# Patient Record
Sex: Female | Born: 2000
Health system: Southern US, Community
[De-identification: ages and names within clinical notes are randomized; demographics above are authoritative.]

## PROBLEM LIST (undated history)

## (undated) DIAGNOSIS — T7840XA Allergy, unspecified, initial encounter: Secondary | ICD-10-CM

---
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
## 2021-01-12 DIAGNOSIS — F331 Major depressive disorder, recurrent, moderate: Secondary | ICD-10-CM | POA: Diagnosis not present

## 2021-01-22 DIAGNOSIS — J4520 Mild intermittent asthma, uncomplicated: Secondary | ICD-10-CM | POA: Diagnosis not present

## 2021-01-22 DIAGNOSIS — M549 Dorsalgia, unspecified: Secondary | ICD-10-CM | POA: Diagnosis not present

## 2021-01-22 DIAGNOSIS — F332 Major depressive disorder, recurrent severe without psychotic features: Secondary | ICD-10-CM | POA: Diagnosis not present

## 2021-01-22 DIAGNOSIS — Z0000 Encounter for general adult medical examination without abnormal findings: Secondary | ICD-10-CM | POA: Diagnosis not present

## 2021-01-23 DIAGNOSIS — Z0000 Encounter for general adult medical examination without abnormal findings: Secondary | ICD-10-CM | POA: Diagnosis not present

## 2021-01-23 DIAGNOSIS — F338 Other recurrent depressive disorders: Secondary | ICD-10-CM | POA: Diagnosis not present

## 2021-01-23 DIAGNOSIS — M549 Dorsalgia, unspecified: Secondary | ICD-10-CM | POA: Diagnosis not present

## 2021-01-23 DIAGNOSIS — J4520 Mild intermittent asthma, uncomplicated: Secondary | ICD-10-CM | POA: Diagnosis not present

## 2021-01-23 DIAGNOSIS — Z13220 Encounter for screening for lipoid disorders: Secondary | ICD-10-CM | POA: Diagnosis not present

## 2021-01-26 DIAGNOSIS — F331 Major depressive disorder, recurrent, moderate: Secondary | ICD-10-CM | POA: Diagnosis not present

## 2021-02-23 DIAGNOSIS — F331 Major depressive disorder, recurrent, moderate: Secondary | ICD-10-CM | POA: Diagnosis not present

## 2021-03-24 DIAGNOSIS — F331 Major depressive disorder, recurrent, moderate: Secondary | ICD-10-CM | POA: Diagnosis not present

## 2021-04-21 DIAGNOSIS — F331 Major depressive disorder, recurrent, moderate: Secondary | ICD-10-CM | POA: Diagnosis not present

## 2021-06-03 DIAGNOSIS — J301 Allergic rhinitis due to pollen: Secondary | ICD-10-CM | POA: Diagnosis not present

## 2021-06-03 DIAGNOSIS — J4540 Moderate persistent asthma, uncomplicated: Secondary | ICD-10-CM | POA: Diagnosis not present

## 2021-06-03 DIAGNOSIS — J3089 Other allergic rhinitis: Secondary | ICD-10-CM | POA: Diagnosis not present

## 2021-06-03 DIAGNOSIS — R21 Rash and other nonspecific skin eruption: Secondary | ICD-10-CM | POA: Diagnosis not present

## 2021-06-12 DIAGNOSIS — F332 Major depressive disorder, recurrent severe without psychotic features: Secondary | ICD-10-CM | POA: Diagnosis not present

## 2021-06-12 DIAGNOSIS — R682 Dry mouth, unspecified: Secondary | ICD-10-CM | POA: Diagnosis not present

## 2021-06-18 DIAGNOSIS — F331 Major depressive disorder, recurrent, moderate: Secondary | ICD-10-CM | POA: Diagnosis not present

## 2021-08-18 DIAGNOSIS — F331 Major depressive disorder, recurrent, moderate: Secondary | ICD-10-CM | POA: Diagnosis not present

## 2021-09-15 DIAGNOSIS — F331 Major depressive disorder, recurrent, moderate: Secondary | ICD-10-CM | POA: Diagnosis not present

## 2021-10-20 DIAGNOSIS — F331 Major depressive disorder, recurrent, moderate: Secondary | ICD-10-CM | POA: Diagnosis not present
